# Patient Record
Sex: Male | Born: 1965 | Race: White | Hispanic: No | Marital: Married | Smoking: Never smoker
Health system: Southern US, Community
[De-identification: ages and names within clinical notes are randomized; demographics above are authoritative.]

## PROBLEM LIST (undated history)

## (undated) DIAGNOSIS — E785 Hyperlipidemia, unspecified: Secondary | ICD-10-CM

## (undated) DIAGNOSIS — I10 Essential (primary) hypertension: Secondary | ICD-10-CM

## (undated) DIAGNOSIS — E119 Type 2 diabetes mellitus without complications: Secondary | ICD-10-CM

## (undated) HISTORY — DX: Hyperlipidemia, unspecified: E78.5

## (undated) HISTORY — DX: Essential (primary) hypertension: I10

## (undated) HISTORY — DX: Type 2 diabetes mellitus without complications: E11.9

---

## 1998-06-16 ENCOUNTER — Emergency Department (HOSPITAL_COMMUNITY): Admission: EM | Admit: 1998-06-16 | Discharge: 1998-06-16 | Payer: Self-pay | Admitting: Internal Medicine

## 2002-09-03 ENCOUNTER — Encounter: Payer: Self-pay | Admitting: *Deleted

## 2002-09-03 ENCOUNTER — Ambulatory Visit (HOSPITAL_COMMUNITY): Admission: RE | Admit: 2002-09-03 | Discharge: 2002-09-03 | Payer: Self-pay | Admitting: *Deleted

## 2013-09-27 ENCOUNTER — Encounter: Payer: Self-pay | Admitting: *Deleted

## 2013-09-27 ENCOUNTER — Encounter: Payer: BC Managed Care – PPO | Attending: Family Medicine | Admitting: *Deleted

## 2013-09-27 VITALS — Ht 71.5 in | Wt 243.0 lb

## 2013-09-27 DIAGNOSIS — Z713 Dietary counseling and surveillance: Secondary | ICD-10-CM | POA: Insufficient documentation

## 2013-09-27 DIAGNOSIS — E119 Type 2 diabetes mellitus without complications: Secondary | ICD-10-CM

## 2013-09-27 DIAGNOSIS — Z794 Long term (current) use of insulin: Secondary | ICD-10-CM | POA: Insufficient documentation

## 2013-09-27 NOTE — Progress Notes (Signed)
Appt start time: 1100 end time:  1230.  Assessment:  Patient was seen on  09/27/13 for individual diabetes education. He lives with his wife who is a Designer, television/film set and his daughter who is in high school.  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 He states he is now SMBG once a day in AM with reported range of 180 - 215 mg/dl. He is taking Lantus at bedtime usually around 10 PM.  He enjoys playing golf, visiting historical places and mild hiking in good weather.  Current HbA1c: 8.9%  Preferred Learning Style:   No preference indicated   Learning Readiness:   Contemplating  Change in progress  MEDICATIONS: see list, diabetes medication is Lantus, Metformin and Onglyza  DIETARY INTAKE:  24-hr recall:  B ( AM): one egg with added egg white, coffee and water  Snk ( AM): no  L ( PM): usually buys at school- vegetable and fruit, entree which are usually breaded OR brings from home: lean meat sandwich with whole wheat bread and pretzels, water Snk ( PM): diet soda, occasionally more pretzels, then popcorn when gets home D ( PM): lean meat, salad, vegetables, starch, water Snk ( PM): pretzels or pita chips occasionally or nuts Beverages: water, coffee  Usual physical activity: walks dog slowly  Estimated energy needs: 1800 calories 200 g carbohydrates 135 g protein 50 g fat  Progress Towards Goal(s):  In progress.   Nutritional Diagnosis:  NB-1.1 Food and nutrition-related knowledge deficit As related to diabetes management.  As evidenced by A1c of 8.9%.    Intervention:  Nutrition counseling provided.  Discussed diabetes disease process and treatment options.  Discussed physiology of diabetes and role of obesity on insulin resistance.  Encouraged moderate weight reduction to improve glucose levels.  Discussed role of medications and diet in glucose control  Provided education on macronutrients on glucose levels.   Provided education on carb counting, importance of regularly scheduled meals/snacks, and meal planning  Discussed effects of physical activity on glucose levels and long-term glucose control.  Recommended 150 minutes of physical activity/week.  Reviewed patient medications.  Discussed role of medication on blood glucose and possible side effects  Discussed blood glucose monitoring and interpretation.  Discussed recommended target ranges and individual ranges.    Described short-term complications: hyper- and hypo-glycemia.  Discussed causes,symptoms, and treatment options.  Discussed prevention, detection, and treatment of long-term complications.  Discussed the role of prolonged elevated glucose levels on body systems.  Discussed role of stress on blood glucose levels and discussed strategies to manage psychosocial issues.  Discussed recommendations for long-term diabetes self-care.  Provided checklist for medical, dental, and emotional self-care.  Plan:  Aim for 3 Carb Choices per meal (45+ grams) +/- 1 either way  Aim for 0-2 Carbs per snack if hungry  Consider including protein in moderation with your meals ans snacks Consider reading food labels for Total Carbohydrate of foods Consider  increasing your activity level as tolerated Ask MD for Rx for your strips and lancets so they can be covered by your insurance company Consider checking BG at alternate times per day  Continue taking medication Lantus at night as directed by MD Teaching Method Utilized: Visual, Auditory and Hands on  Handouts given during visit include: Living Well with Diabetes Carb Counting and Food Label handouts Meal Plan Card  Barriers to learning/adherence to lifestyle change: dealing with having a chronic disease so early in  his life  Diabetes self-care support plan:   [facility] support group  Demonstrated degree of understanding via:  Teach Back   Monitoring/Evaluation:  Dietary intake, exercise,  reading food labels, SMBG, and body weight in 6 week(s).

## 2013-09-27 NOTE — Patient Instructions (Signed)
Plan:  Aim for 3 Carb Choices per meal (45+ grams) +/- 1 either way  Aim for 0-2 Carbs per snack if hungry  Consider including protein in moderation with your meals ans snacks Consider reading food labels for Total Carbohydrate of foods Consider  increasing your activity level as tolerated Ask MD for Rx for your strips and lancets so they can be covered by your insurance company Consider checking BG at alternate times per day  Continue taking medication Lantus at night as directed by MD

## 2013-11-14 ENCOUNTER — Ambulatory Visit: Payer: BC Managed Care – PPO | Admitting: *Deleted

## 2013-11-20 ENCOUNTER — Ambulatory Visit: Payer: BC Managed Care – PPO | Admitting: *Deleted

## 2014-01-01 ENCOUNTER — Other Ambulatory Visit: Payer: Self-pay | Admitting: Otolaryngology

## 2014-01-01 DIAGNOSIS — D333 Benign neoplasm of cranial nerves: Secondary | ICD-10-CM

## 2014-01-08 ENCOUNTER — Other Ambulatory Visit: Payer: BC Managed Care – PPO

## 2014-01-09 ENCOUNTER — Ambulatory Visit
Admission: RE | Admit: 2014-01-09 | Discharge: 2014-01-09 | Disposition: A | Discharge status: Home / Self Care | Payer: BC Managed Care – PPO | Source: Ambulatory Visit | Attending: Otolaryngology | Admitting: Otolaryngology

## 2014-01-09 DIAGNOSIS — D333 Benign neoplasm of cranial nerves: Secondary | ICD-10-CM

## 2014-01-09 MED ORDER — GADOBENATE DIMEGLUMINE 529 MG/ML IV SOLN
20.0000 mL | Freq: Once | INTRAVENOUS | Status: AC | PRN
  Administered 2014-01-09: 20 mL via INTRAVENOUS

## 2015-08-22 ENCOUNTER — Other Ambulatory Visit: Payer: Self-pay | Admitting: Family Medicine

## 2015-08-22 DIAGNOSIS — N50812 Left testicular pain: Secondary | ICD-10-CM

## 2015-11-10 ENCOUNTER — Ambulatory Visit
Admission: RE | Admit: 2015-11-10 | Discharge: 2015-11-10 | Disposition: A | Discharge status: Home / Self Care | Payer: BC Managed Care – PPO | Source: Ambulatory Visit | Attending: Family Medicine | Admitting: Family Medicine

## 2015-11-10 DIAGNOSIS — N50812 Left testicular pain: Secondary | ICD-10-CM

## 2019-03-21 ENCOUNTER — Other Ambulatory Visit: Payer: Self-pay | Admitting: Family Medicine

## 2019-03-21 DIAGNOSIS — R2232 Localized swelling, mass and lump, left upper limb: Secondary | ICD-10-CM

## 2019-03-22 ENCOUNTER — Other Ambulatory Visit: Payer: BC Managed Care – PPO

## 2019-03-28 ENCOUNTER — Other Ambulatory Visit: Payer: Self-pay | Admitting: Family Medicine

## 2019-03-30 ENCOUNTER — Ambulatory Visit
Admission: RE | Admit: 2019-03-30 | Discharge: 2019-03-30 | Disposition: A | Discharge status: Home / Self Care | Payer: BC Managed Care – PPO | Source: Ambulatory Visit | Attending: Family Medicine | Admitting: Family Medicine

## 2019-03-30 ENCOUNTER — Other Ambulatory Visit: Payer: Self-pay

## 2019-03-30 DIAGNOSIS — R2232 Localized swelling, mass and lump, left upper limb: Secondary | ICD-10-CM

## 2019-08-04 ENCOUNTER — Ambulatory Visit: Payer: BC Managed Care – PPO

## 2019-08-18 ENCOUNTER — Ambulatory Visit: Payer: BC Managed Care – PPO

## 2020-09-15 ENCOUNTER — Ambulatory Visit (INDEPENDENT_AMBULATORY_CARE_PROVIDER_SITE_OTHER): Payer: BC Managed Care – PPO

## 2020-09-15 ENCOUNTER — Encounter: Payer: Self-pay | Admitting: Podiatry

## 2020-09-15 ENCOUNTER — Other Ambulatory Visit: Payer: Self-pay

## 2020-09-15 ENCOUNTER — Ambulatory Visit (INDEPENDENT_AMBULATORY_CARE_PROVIDER_SITE_OTHER): Payer: BC Managed Care – PPO | Admitting: Podiatry

## 2020-09-15 DIAGNOSIS — G629 Polyneuropathy, unspecified: Secondary | ICD-10-CM | POA: Diagnosis not present

## 2020-09-15 DIAGNOSIS — M722 Plantar fascial fibromatosis: Secondary | ICD-10-CM

## 2020-09-15 MED ORDER — DICLOFENAC SODIUM 75 MG PO TBEC: 75.0000 mg | DELAYED_RELEASE_TABLET | Freq: Two times a day (BID) | ORAL | 0 refills | Status: AC

## 2020-09-15 MED ORDER — TRIAMCINOLONE ACETONIDE 10 MG/ML IJ SUSP: 10.0000 mg | Freq: Once | INTRAMUSCULAR | Status: AC

## 2020-09-15 NOTE — Patient Instructions (Signed)

## 2020-09-17 NOTE — Progress Notes (Signed)
Subjective:   Patient ID: Benjamin Sullivan, male   DOB: 55 y.o.   MRN: [id number]   HPI Patient presents with exquisite discomfort plantar aspect left heel that is been very tender making it hard for him to walk.  He needs to be active and is a diabetic and needs to lose some weight and does not smoke   Review of Systems  All other systems reviewed and are negative.       Objective:  Physical Exam Vitals and nursing note reviewed.  Constitutional:      Appearance: He is well-developed.  Pulmonary:     Effort: Pulmonary effort is normal.  Musculoskeletal:        General: Normal range of motion.  Skin:    General: Skin is warm.  Neurological:     Mental Status: He is alert.     Vascular status intact mild diminishment neurological sensation with long-term diabetes that he needs to get to a better level with his last A1c approximate 7.5.  Patient has exquisite discomfort plantar aspect left heel insertional point tendon calcaneus inflammation fluid around the medial band moderate depression of the arch with good digital perfusion well oriented      Assessment:  Acute plantar fasciitis left along with moderate neuropathic symptomatology with long-term diabetes with moderate obesity     Plan:  H&P reviewed all conditions x-ray and educated him on the importance of weight control exercise and reduction of the A1c.  Discussed daily foot exams today we will get a focus on the heel and I did sterile prep and injected the plantar fascia 3 mg Kenalog 5 mg Xylocaine applied fascial brace gave instructions for supportive therapy discussed possible long-term orthotics placed on diclofenac 75 mg twice daily and reappoint to recheck  X-rays indicate small spur no indication stress fracture or advanced arthritis

## 2020-10-01 ENCOUNTER — Encounter: Payer: Self-pay | Admitting: Podiatry

## 2020-10-01 ENCOUNTER — Ambulatory Visit: Payer: BC Managed Care – PPO | Admitting: Podiatry

## 2020-10-01 ENCOUNTER — Other Ambulatory Visit: Payer: Self-pay

## 2020-10-01 DIAGNOSIS — M722 Plantar fascial fibromatosis: Secondary | ICD-10-CM

## 2020-10-01 MED ORDER — TRIAMCINOLONE ACETONIDE 10 MG/ML IJ SUSP
10.0000 mg | Freq: Once | INTRAMUSCULAR | Status: AC
  Administered 2020-10-01: 10 mg

## 2020-10-01 NOTE — Progress Notes (Signed)
Subjective:   Patient ID: Benjamin Sullivan, male   DOB: 55 y.o.   MRN: [id number]   HPI Patient states improving but still has area of discomfort   ROS      Objective:  Physical Exam  Neurovascular status intact continued discomfort plantar fascial left with improvement but pain still noted upon palpation     Assessment:  Planter fasciitis left still present but improved     Plan:  H&P reviewed condition went ahead and did sterile prep and we will do 1 more cortisone injection to try to completely reduce symptoms with 3 mg Dexasone Kenalog 5 mg Xylocaine and if symptoms persist will consider orthotics or other treatments in future

## 2021-03-07 IMAGING — MG MM DIGITAL DIAGNOSTIC BILAT W/ TOMO W/ CAD
6 of 12 series · 6 of 36 positions shown · non-contrast
Comparison: None.

ACR Breast Density Category a: The breast tissue is almost entirely
fatty.

CLINICAL DATA: 53-year-old male presenting for evaluation of a
palpable nontender lump in the left axilla for 1 month.

EXAM:
DIGITAL DIAGNOSTIC BILATERAL MAMMOGRAM WITH CAD AND TOMO
ULTRASOUND LEFT AXILLA

[R MLO synth-2D]
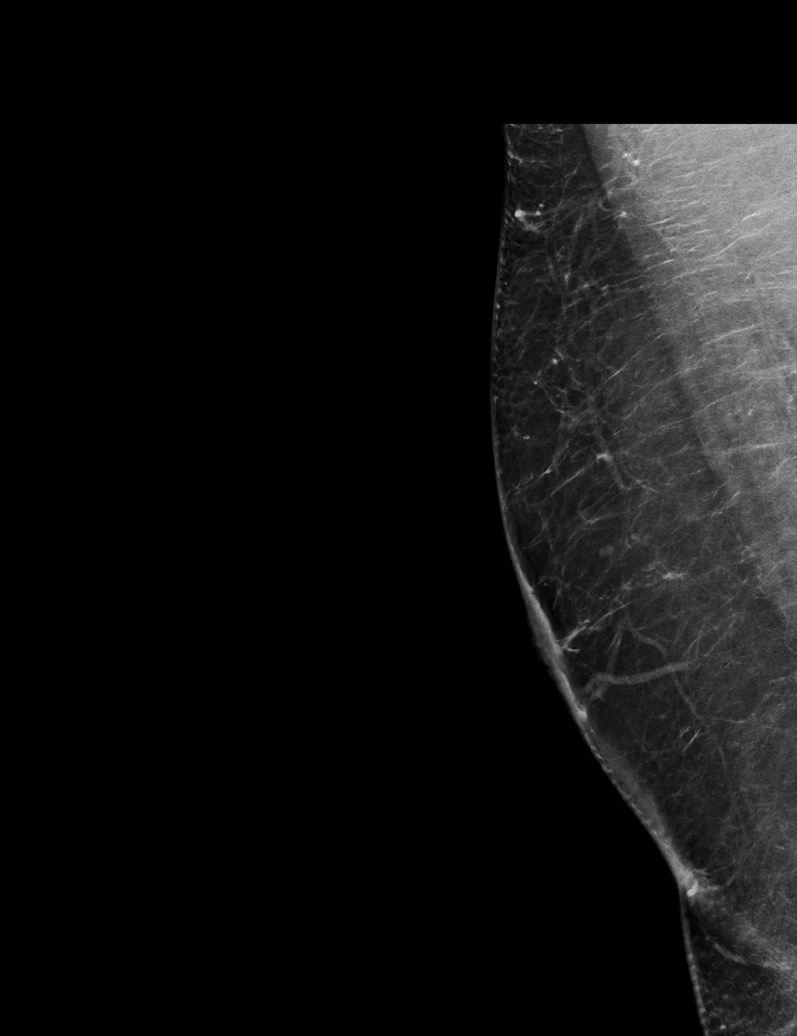

[L MLO synth-2D (1 of 2)]
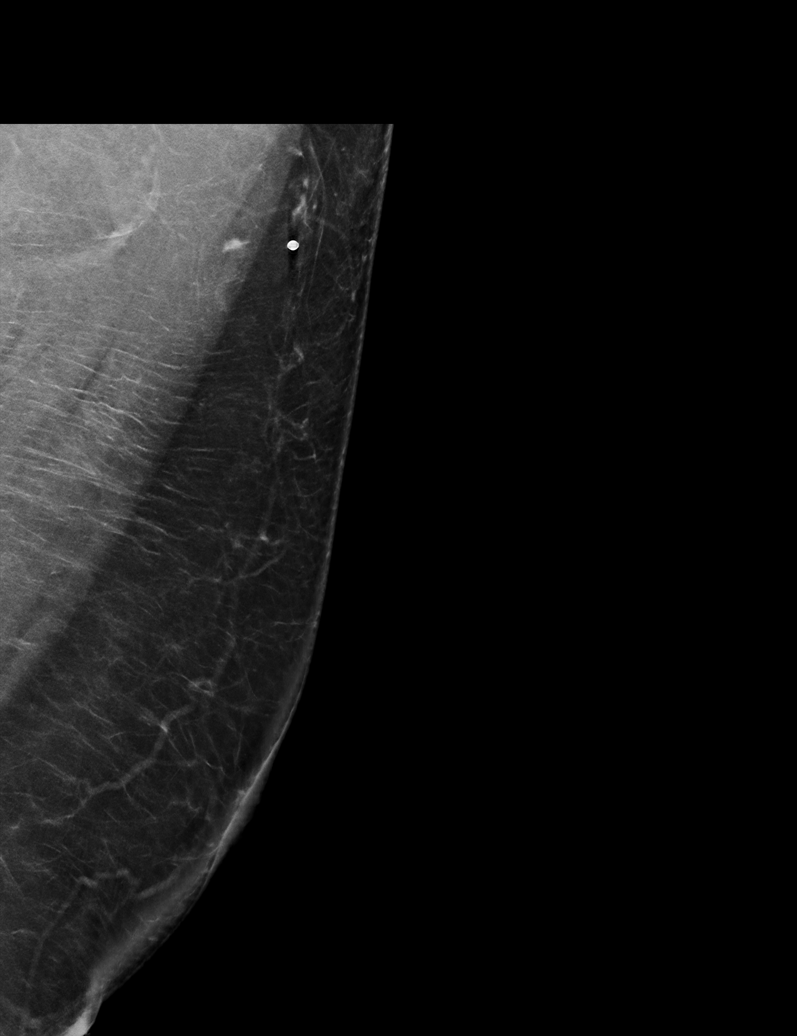

[L MLO synth-2D (2 of 2)]
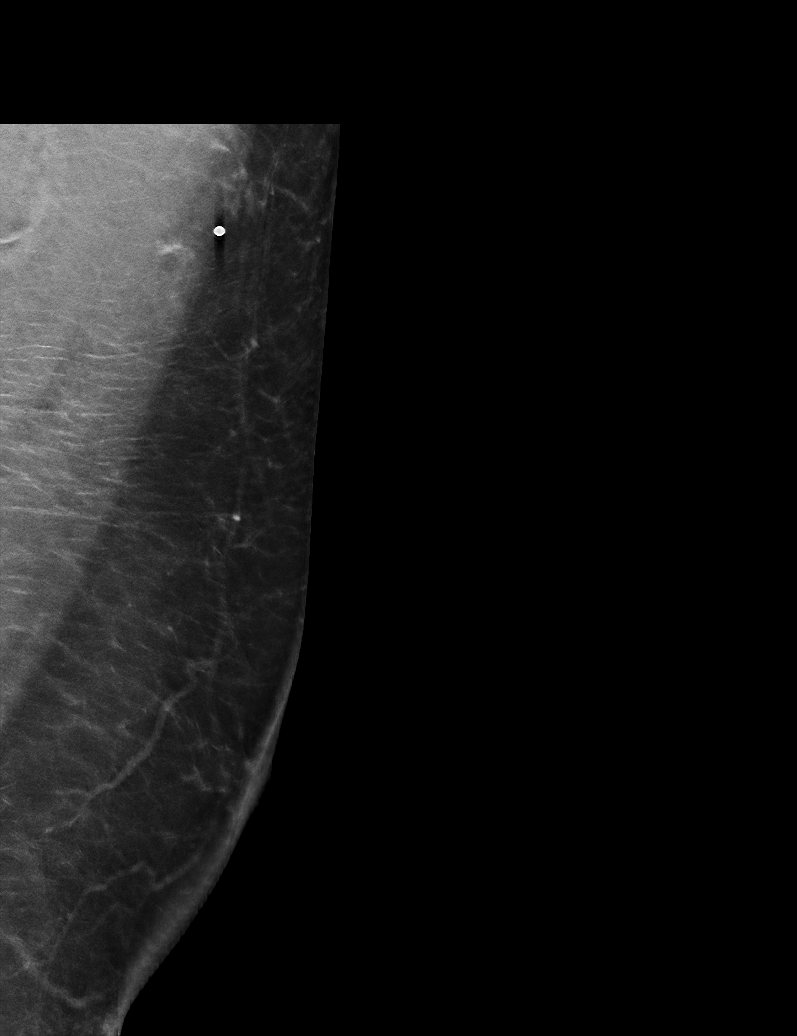

[L TAN synth-2D]
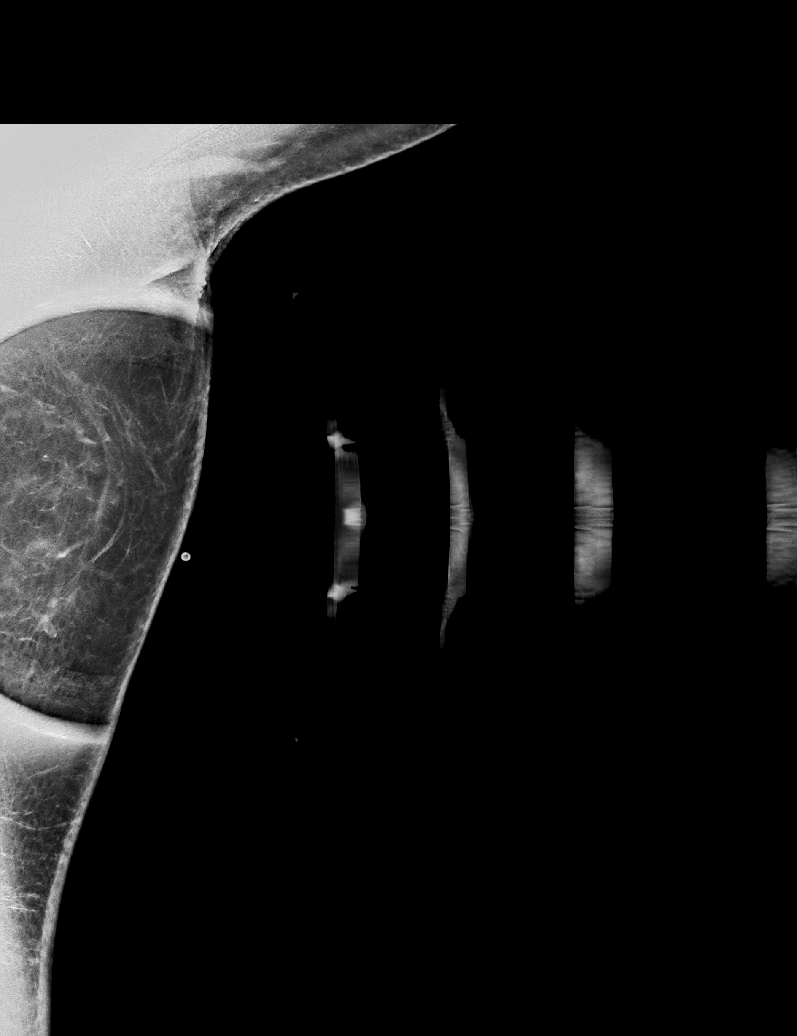

[L CC synth-2D]
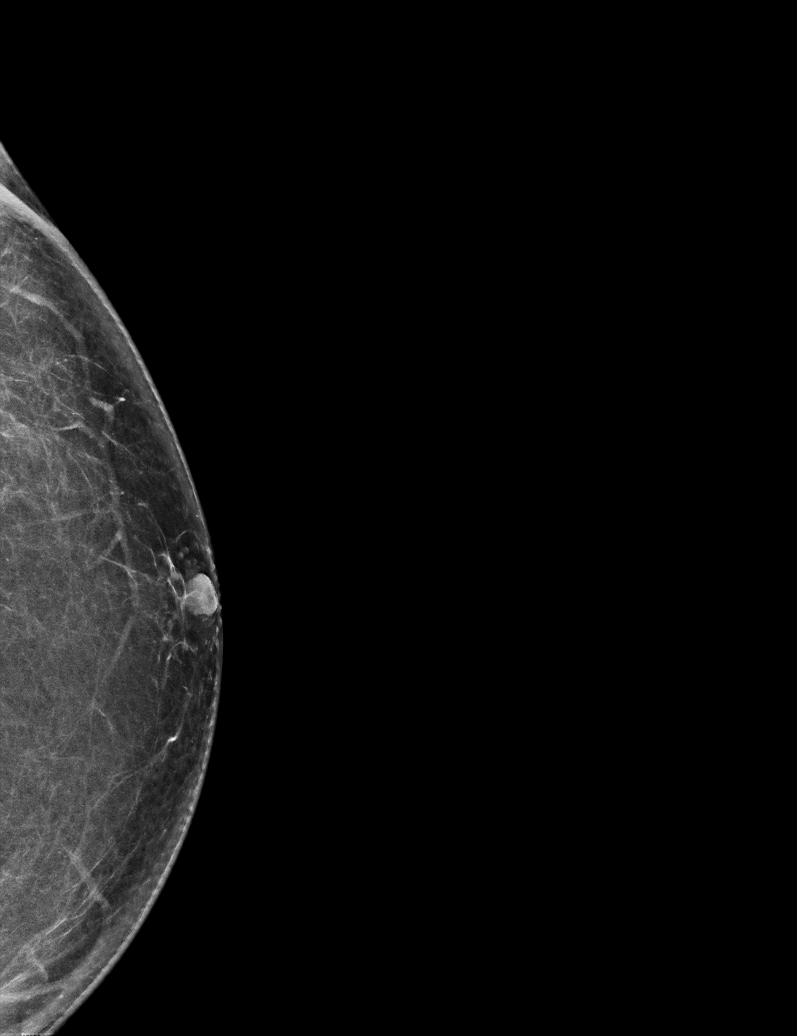

[R CC synth-2D]
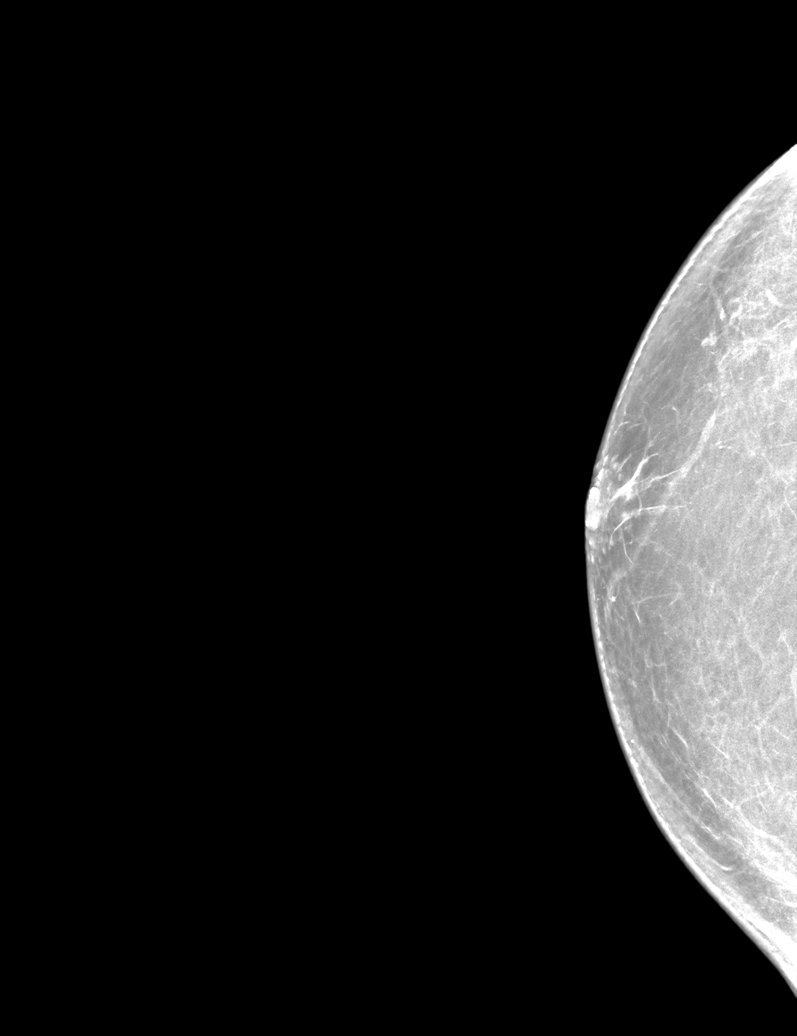

[6 of 36 positions shown; findings below may reference images not displayed]

FINDINGS: A BB has been placed on the left axilla indicating the palpable site
of concern. There are no suspicious mammographic findings deep to
the marker. There are 2 thin curvilinear ribbons of tissue, with
branching internal vascularity suggestive of lymph nodes. No
suspicious calcifications, masses or areas of distortion are seen in
the bilateral breasts.

Mammographic images were processed with CAD.

Physical exam of the palpable site in the left axilla demonstrates a
broad area of fullness spanning about 10 cm.

Ultrasound targeted to the left axilla demonstrates 2 adjacent large
lymph nodes with a very thin cortical ribbon of at most 1 mm, each
measuring 4.8 and 4.2 cm respectively. No masses or suspicious
areas of shadowing are identified.
IMPRESSION: 1. There are 2 adjacent large lymph nodes at the palpable site in
the left axilla about 4.8 and 4.2 cm respectively. The large, the
lymph nodes are benign in appearance with an exceedingly thin
cortical ribbon.

2.  No mammographic evidence of malignancy in the bilateral breasts.

RECOMMENDATION:
Clinical follow-up recommended.

I have discussed the findings and recommendations with the patient.
If applicable, a reminder letter will be sent to the patient
regarding the next appointment.

BI-RADS CATEGORY  1: Negative.

## 2021-05-01 ENCOUNTER — Other Ambulatory Visit: Payer: Self-pay

## 2021-05-01 ENCOUNTER — Ambulatory Visit (HOSPITAL_BASED_OUTPATIENT_CLINIC_OR_DEPARTMENT_OTHER)
Admission: RE | Admit: 2021-05-01 | Discharge: 2021-05-01 | Disposition: A | Discharge status: Home / Self Care | Payer: BC Managed Care – PPO | Source: Ambulatory Visit | Attending: Family Medicine | Admitting: Family Medicine

## 2021-05-01 ENCOUNTER — Other Ambulatory Visit (HOSPITAL_BASED_OUTPATIENT_CLINIC_OR_DEPARTMENT_OTHER): Payer: Self-pay | Admitting: Family Medicine

## 2021-05-01 DIAGNOSIS — R1013 Epigastric pain: Secondary | ICD-10-CM

## 2023-04-09 IMAGING — US US ABDOMEN LIMITED
1 series · 14 of 25 positions shown · non-contrast
Comparison: None.

CLINICAL DATA: Epigastric abdominal pain for several months.

EXAM:
ULTRASOUND ABDOMEN LIMITED RIGHT UPPER QUADRANT

[Series 1: us abdomen limited ruq (liver/gb) · 92 acquisitions, 14 frames shown]
[im 1/92]
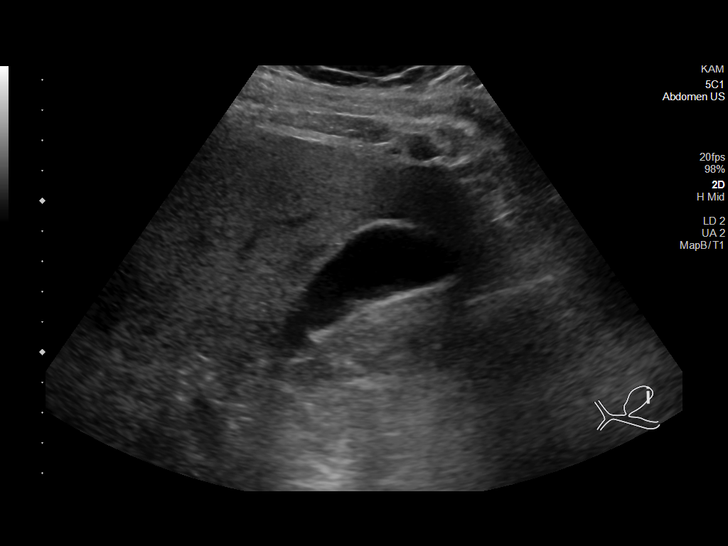
[im 8/92]
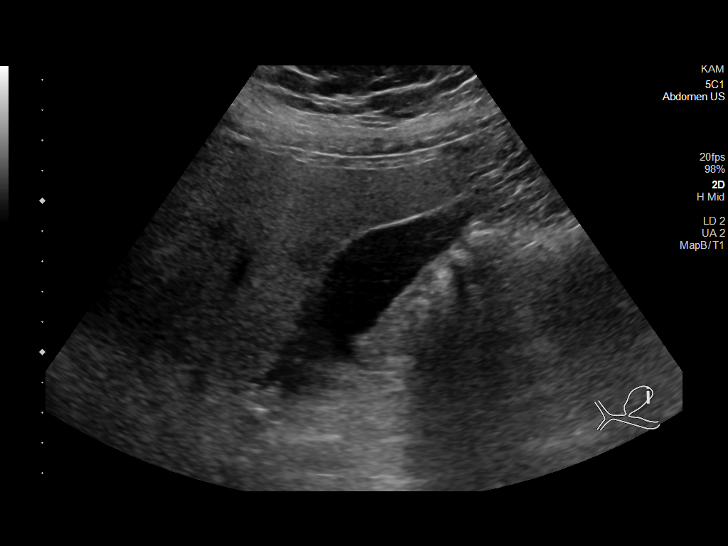
[im 16/92]
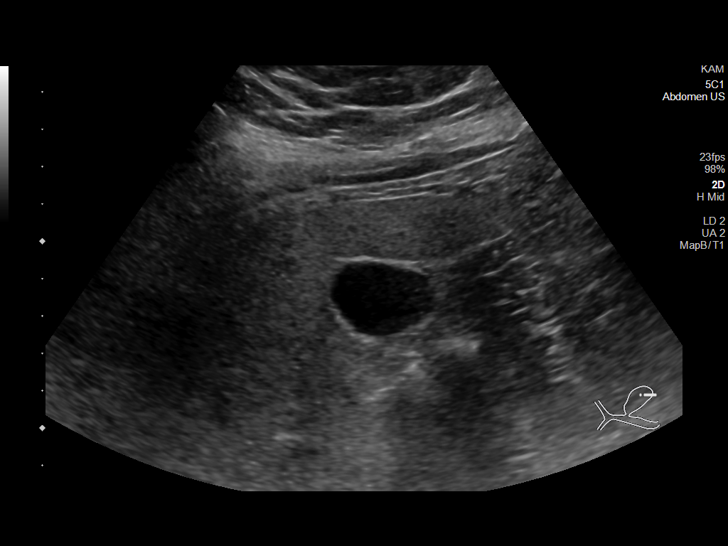
[im 23/92]
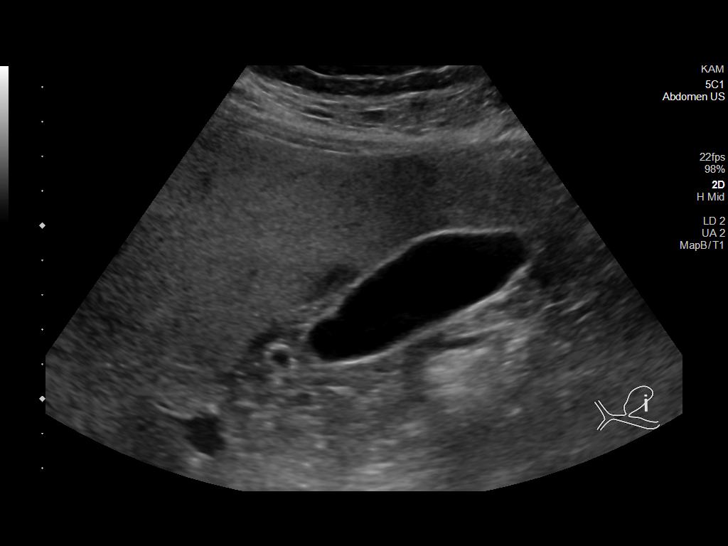
[im 31/92]
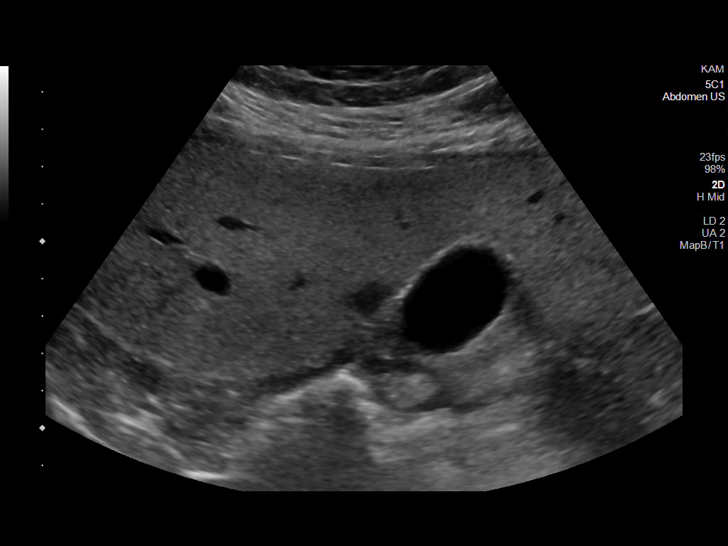
[im 35/92]
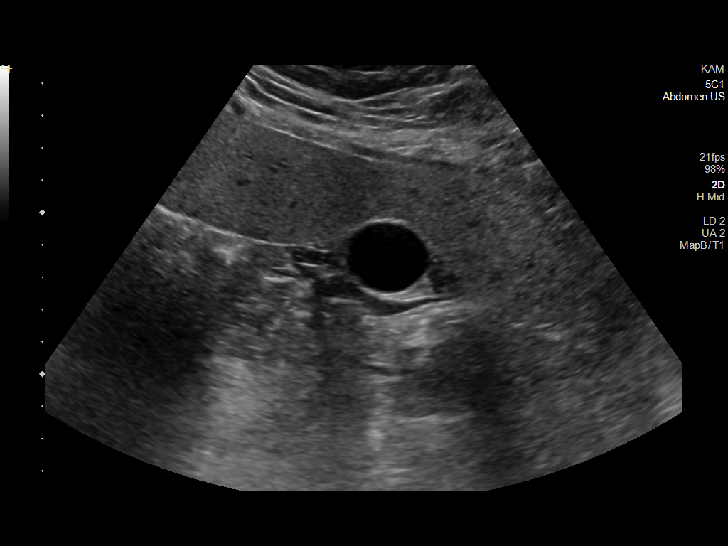
[im 42/92]
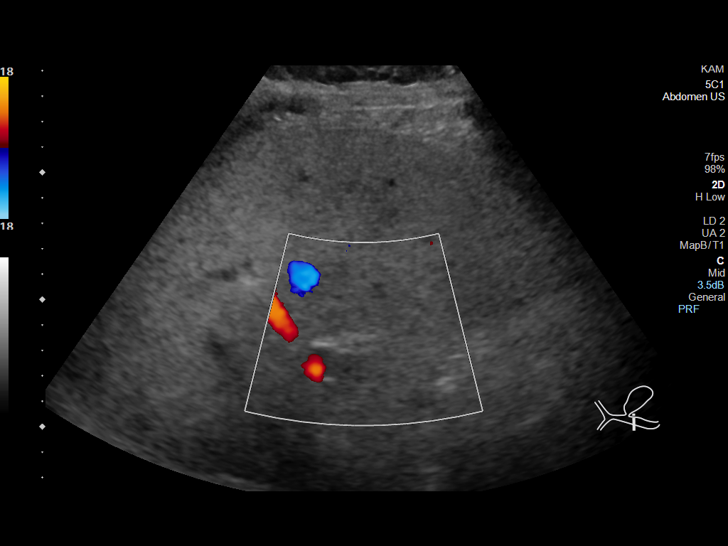
[im 50/92]
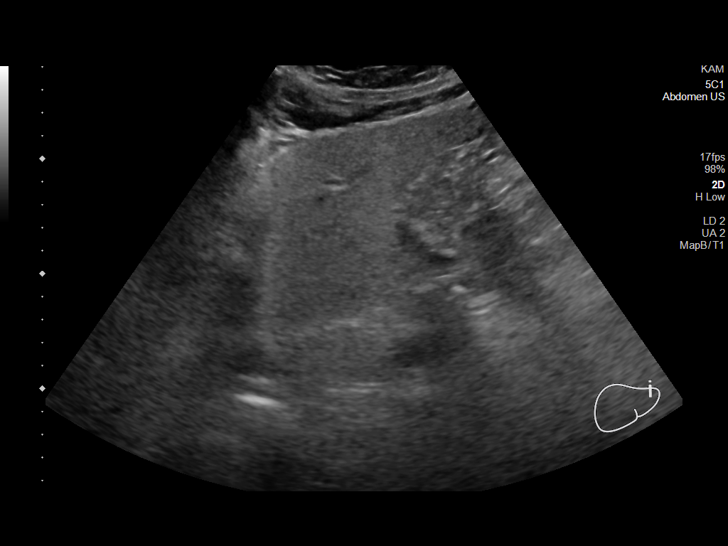
[im 57/92]
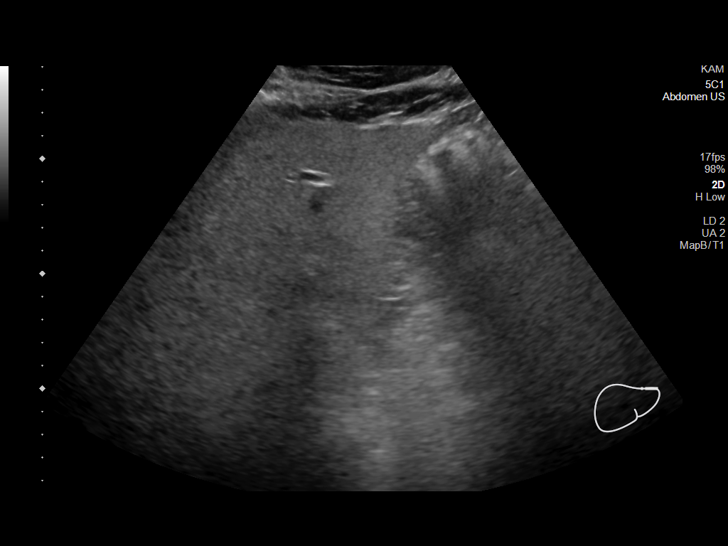
[im 61/92]
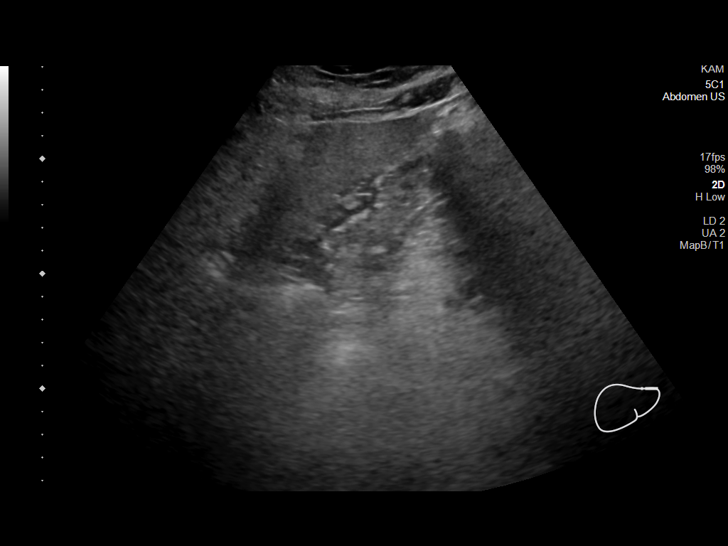
[im 69/92]
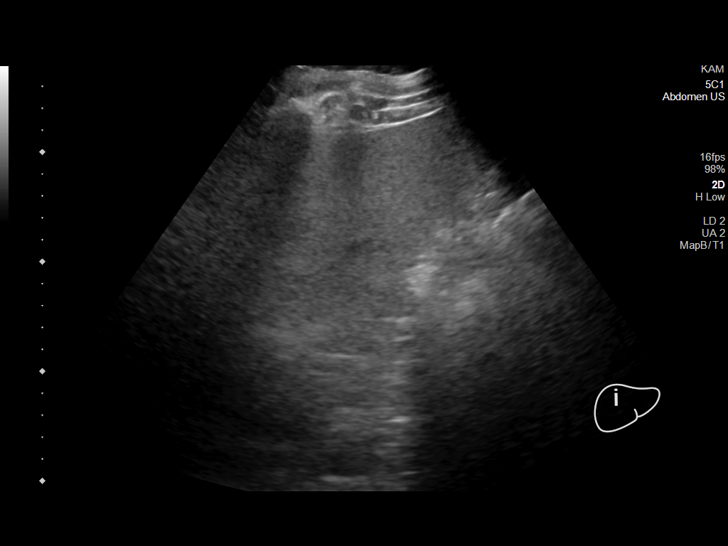
[im 76/92]
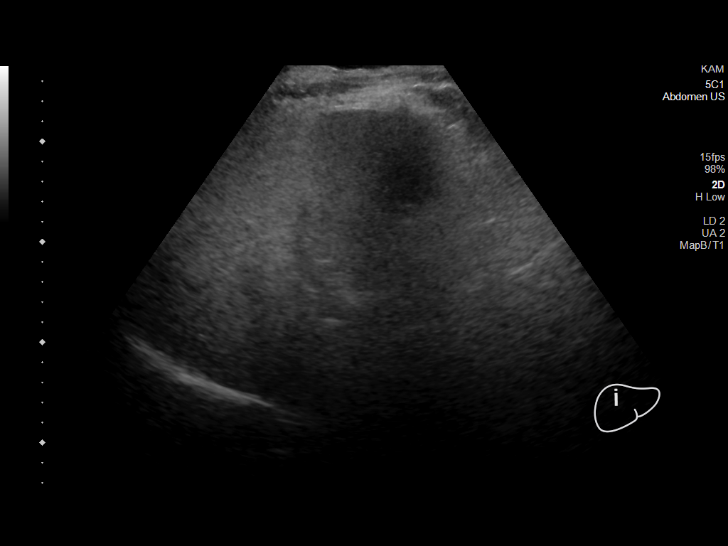
[im 84/92]
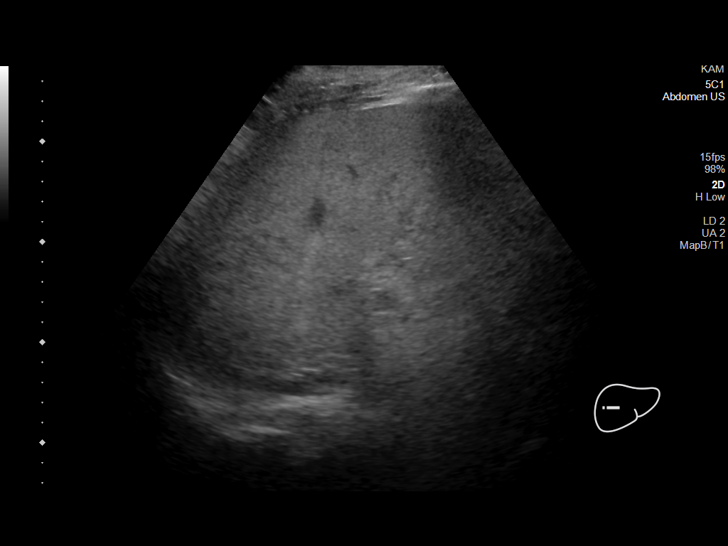
[im 92/92]
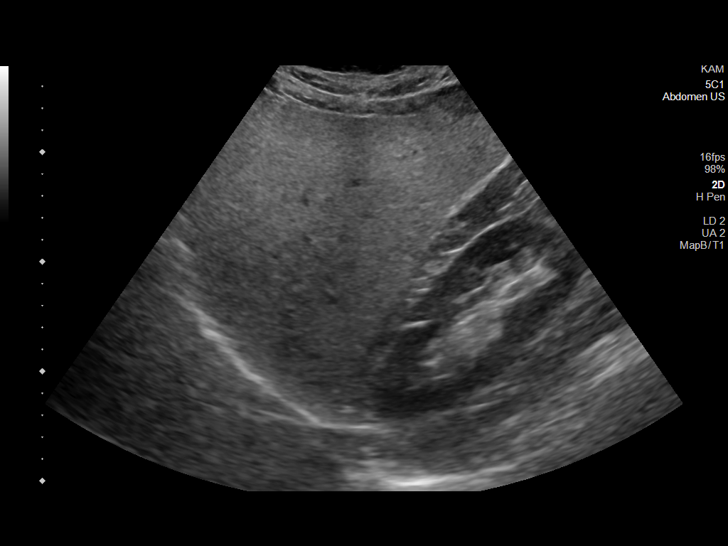

[14 of 25 positions shown; findings below may reference images not displayed]

FINDINGS: Gallbladder:

No gallstones or wall thickening visualized. No sonographic Murphy
sign noted by sonographer. Small amount of sludge is noted within
the gallbladder lumen.

Common bile duct:

Diameter: 3 mm which is within normal limits.

Liver:

Increased echogenicity of hepatic parenchyma is noted most
consistent with hepatic steatosis with probable sparing adjacent to
gallbladder fossa. Portal vein is patent on color Doppler imaging
with normal direction of blood flow towards the liver.

Other: None.
IMPRESSION: Hepatic steatosis.

Small amount of sludge present within gallbladder.

## 2024-07-31 ENCOUNTER — Ambulatory Visit: Admitting: Cardiology
